# Patient Record
Sex: Male | Born: 1965 | Race: White | Hispanic: No | Marital: Single
Health system: Southern US, Community
[De-identification: ages and names within clinical notes are randomized; demographics above are authoritative.]

---

## 1999-12-01 ENCOUNTER — Emergency Department (HOSPITAL_COMMUNITY): Admission: EM | Admit: 1999-12-01 | Discharge: 1999-12-01 | Payer: Self-pay | Admitting: Unknown Physician Specialty

## 2000-10-16 ENCOUNTER — Encounter: Admission: RE | Admit: 2000-10-16 | Discharge: 2000-10-16 | Payer: Self-pay | Admitting: Internal Medicine

## 2000-10-16 ENCOUNTER — Encounter: Payer: Self-pay | Admitting: Internal Medicine

## 2007-12-25 ENCOUNTER — Ambulatory Visit: Payer: Self-pay | Admitting: Internal Medicine

## 2011-07-15 ENCOUNTER — Encounter: Payer: Self-pay | Admitting: Internal Medicine

## 2011-11-14 ENCOUNTER — Emergency Department (HOSPITAL_COMMUNITY)
Admission: EM | Admit: 2011-11-14 | Discharge: 2011-11-15 | Disposition: A | Discharge status: Home / Self Care | Payer: No Typology Code available for payment source | Attending: Emergency Medicine | Admitting: Emergency Medicine

## 2011-11-14 ENCOUNTER — Encounter (HOSPITAL_COMMUNITY): Payer: Self-pay | Admitting: *Deleted

## 2011-11-14 DIAGNOSIS — F17200 Nicotine dependence, unspecified, uncomplicated: Secondary | ICD-10-CM | POA: Insufficient documentation

## 2011-11-14 DIAGNOSIS — Y92410 Unspecified street and highway as the place of occurrence of the external cause: Secondary | ICD-10-CM | POA: Insufficient documentation

## 2011-11-14 DIAGNOSIS — Y998 Other external cause status: Secondary | ICD-10-CM | POA: Insufficient documentation

## 2011-11-14 DIAGNOSIS — Y93I9 Activity, other involving external motion: Secondary | ICD-10-CM | POA: Insufficient documentation

## 2011-11-14 DIAGNOSIS — S161XXA Strain of muscle, fascia and tendon at neck level, initial encounter: Secondary | ICD-10-CM

## 2011-11-14 DIAGNOSIS — S139XXA Sprain of joints and ligaments of unspecified parts of neck, initial encounter: Secondary | ICD-10-CM | POA: Insufficient documentation

## 2011-11-14 DIAGNOSIS — S5010XA Contusion of unspecified forearm, initial encounter: Secondary | ICD-10-CM

## 2011-11-14 MED ORDER — ONDANSETRON 8 MG PO TBDP
ORAL_TABLET | ORAL | Status: AC
  Administered 2011-11-14: 22:00:00
  Filled 2011-11-14: qty 1
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
## 2011-11-15 ENCOUNTER — Emergency Department (HOSPITAL_COMMUNITY): Payer: No Typology Code available for payment source

## 2011-11-15 MED ORDER — HYDROCODONE-ACETAMINOPHEN 5-325 MG PO TABS: 1.0000 | ORAL_TABLET | ORAL | Status: AC | PRN

## 2011-11-15 MED ORDER — IBUPROFEN 800 MG PO TABS: 800.0000 mg | ORAL_TABLET | Freq: Three times a day (TID) | ORAL | Status: AC

## 2011-11-15 MED ORDER — CYCLOBENZAPRINE HCL 10 MG PO TABS: 10.0000 mg | ORAL_TABLET | Freq: Two times a day (BID) | ORAL | Status: AC | PRN
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
## 2011-11-15 NOTE — ED Provider Notes (Signed)
Medical screening examination/treatment/procedure(s) were performed by non-physician practitioner and as supervising physician I was immediately available for consultation/collaboration.    Vida Roller, MD 11/15/11 [phone]

## 2012-09-11 ENCOUNTER — Other Ambulatory Visit: Payer: Self-pay | Admitting: Internal Medicine

## 2012-09-14 ENCOUNTER — Encounter: Payer: Self-pay | Admitting: Internal Medicine

## 2012-09-14 ENCOUNTER — Other Ambulatory Visit (INDEPENDENT_AMBULATORY_CARE_PROVIDER_SITE_OTHER): Payer: BC Managed Care – PPO | Admitting: Internal Medicine

## 2012-09-14 ENCOUNTER — Ambulatory Visit (INDEPENDENT_AMBULATORY_CARE_PROVIDER_SITE_OTHER): Payer: BC Managed Care – PPO | Admitting: Internal Medicine

## 2012-09-14 VITALS — BP 128/86 | HR 84 | Temp 98.9°F | Ht 71.0 in | Wt 228.0 lb

## 2012-09-14 DIAGNOSIS — Z0000 Encounter for general adult medical examination without abnormal findings: Secondary | ICD-10-CM

## 2012-09-14 DIAGNOSIS — R5383 Other fatigue: Secondary | ICD-10-CM

## 2012-09-14 DIAGNOSIS — E669 Obesity, unspecified: Secondary | ICD-10-CM

## 2012-09-14 DIAGNOSIS — Z13220 Encounter for screening for lipoid disorders: Secondary | ICD-10-CM

## 2012-09-14 DIAGNOSIS — Z23 Encounter for immunization: Secondary | ICD-10-CM

## 2012-09-14 DIAGNOSIS — Z130 Encounter for screening for diseases of the blood and blood-forming organs and certain disorders involving the immune mechanism: Secondary | ICD-10-CM

## 2012-09-14 DIAGNOSIS — M542 Cervicalgia: Secondary | ICD-10-CM

## 2012-09-14 DIAGNOSIS — R5381 Other malaise: Secondary | ICD-10-CM

## 2012-09-14 DIAGNOSIS — J309 Allergic rhinitis, unspecified: Secondary | ICD-10-CM | POA: Insufficient documentation

## 2012-09-14 LAB — CBC WITH DIFFERENTIAL/PLATELET
Basophils Absolute: 0 10*3/uL (ref 0.0–0.1)
Basophils Relative: 0 % (ref 0–1)
Eosinophils Absolute: 0.3 10*3/uL (ref 0.0–0.7)
Eosinophils Relative: 3 % (ref 0–5)
HCT: 44.1 % (ref 39.0–52.0)
Hemoglobin: 15.5 g/dL (ref 13.0–17.0)
Lymphocytes Relative: 20 % (ref 12–46)
Lymphs Abs: 1.6 10*3/uL (ref 0.7–4.0)
MCH: 32.4 pg (ref 26.0–34.0)
MCHC: 35.1 g/dL (ref 30.0–36.0)
MCV: 92.3 fL (ref 78.0–100.0)
Monocytes Absolute: 0.8 10*3/uL (ref 0.1–1.0)
Monocytes Relative: 10 % (ref 3–12)
Neutro Abs: 5.3 10*3/uL (ref 1.7–7.7)
Neutrophils Relative %: 67 % (ref 43–77)
Platelets: 196 10*3/uL (ref 150–400)
RBC: 4.78 MIL/uL (ref 4.22–5.81)
RDW: 13.7 % (ref 11.5–15.5)
WBC: 7.9 10*3/uL (ref 4.0–10.5)

## 2012-09-14 LAB — COMPREHENSIVE METABOLIC PANEL
ALT: 26 U/L (ref 0–53)
AST: 19 U/L (ref 0–37)
Albumin: 4.4 g/dL (ref 3.5–5.2)
Alkaline Phosphatase: 71 U/L (ref 39–117)
BUN: 12 mg/dL (ref 6–23)
CO2: 25 mEq/L (ref 19–32)
Calcium: 9.2 mg/dL (ref 8.4–10.5)
Chloride: 104 mEq/L (ref 96–112)
Creat: 0.98 mg/dL (ref 0.50–1.35)
Glucose, Bld: 87 mg/dL (ref 70–99)
Potassium: 4.4 mEq/L (ref 3.5–5.3)
Sodium: 139 mEq/L (ref 135–145)
Total Bilirubin: 0.9 mg/dL (ref 0.3–1.2)
Total Protein: 6.3 g/dL (ref 6.0–8.3)

## 2012-09-14 LAB — LIPID PANEL
Cholesterol: 142 mg/dL (ref 0–200)
HDL: 33 mg/dL — ABNORMAL LOW (ref >39)
LDL Cholesterol: 90 mg/dL (ref 0–99)
Total CHOL/HDL Ratio: 4.3 Ratio
Triglycerides: 96 mg/dL (ref <150)
VLDL: 19 mg/dL (ref 0–40)

## 2012-09-14 LAB — POCT URINALYSIS DIPSTICK
Ketones, UA: NEGATIVE
Leukocytes, UA: NEGATIVE
Nitrite, UA: NEGATIVE
Protein, UA: NEGATIVE

## 2012-09-14 LAB — TSH: TSH: 2.622 u[IU]/mL (ref 0.350–4.500)

## 2012-09-14 MED ORDER — TETANUS-DIPHTH-ACELL PERTUSSIS 5-2.5-18.5 LF-MCG/0.5 IM SUSP: 0.5000 mL | Freq: Once | INTRAMUSCULAR | Status: AC

## 2012-09-14 NOTE — Patient Instructions (Addendum)
You have been given tetanus update today. Use Flonase nasal spray and try Claritin. Return in one year. Recommend diet and exercise.

## 2012-09-14 NOTE — Addendum Note (Signed)
Addended by: Judy Pimple on: 09/14/2012 10:50 AM   Modules accepted: Orders
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
## 2016-06-07 ENCOUNTER — Other Ambulatory Visit: Payer: BLUE CROSS/BLUE SHIELD | Admitting: Internal Medicine

## 2016-06-07 DIAGNOSIS — Z125 Encounter for screening for malignant neoplasm of prostate: Secondary | ICD-10-CM

## 2016-06-07 DIAGNOSIS — Z13220 Encounter for screening for lipoid disorders: Secondary | ICD-10-CM

## 2016-06-07 DIAGNOSIS — Z0000 Encounter for general adult medical examination without abnormal findings: Secondary | ICD-10-CM

## 2016-06-07 LAB — COMPREHENSIVE METABOLIC PANEL
ALBUMIN: 4.4 g/dL (ref 3.6–5.1)
ALK PHOS: 65 U/L (ref 40–115)
ALT: 21 U/L (ref 9–46)
AST: 18 U/L (ref 10–35)
BUN: 12 mg/dL (ref 7–25)
CALCIUM: 9 mg/dL (ref 8.6–10.3)
CO2: 25 mmol/L (ref 20–31)
Chloride: 104 mmol/L (ref 98–110)
Creat: 1.05 mg/dL (ref 0.70–1.33)
GLUCOSE: 89 mg/dL (ref 65–99)
Potassium: 4.4 mmol/L (ref 3.5–5.3)
Sodium: 140 mmol/L (ref 135–146)
Total Bilirubin: 0.8 mg/dL (ref 0.2–1.2)
Total Protein: 6.5 g/dL (ref 6.1–8.1)

## 2016-06-07 LAB — CBC WITH DIFFERENTIAL/PLATELET
BASOS PCT: 0 %
Basophils Absolute: 0 cells/uL (ref 0–200)
EOS ABS: 280 {cells}/uL (ref 15–500)
Eosinophils Relative: 4 %
HEMATOCRIT: 45.6 % (ref 38.5–50.0)
Hemoglobin: 15.5 g/dL (ref 13.2–17.1)
LYMPHS PCT: 21 %
Lymphs Abs: 1470 cells/uL (ref 850–3900)
MCH: 32.9 pg (ref 27.0–33.0)
MCHC: 34 g/dL (ref 32.0–36.0)
MCV: 96.8 fL (ref 80.0–100.0)
MONO ABS: 490 {cells}/uL (ref 200–950)
MPV: 9.7 fL (ref 7.5–12.5)
Monocytes Relative: 7 %
NEUTROS PCT: 68 %
Neutro Abs: 4760 cells/uL (ref 1500–7800)
Platelets: 197 10*3/uL (ref 140–400)
RBC: 4.71 MIL/uL (ref 4.20–5.80)
RDW: 14 % (ref 11.0–15.0)
WBC: 7 10*3/uL (ref 3.8–10.8)

## 2016-06-07 LAB — LIPID PANEL
Cholesterol: 154 mg/dL (ref <200)
HDL: 41 mg/dL (ref >40)
LDL Cholesterol: 97 mg/dL (ref <100)
TRIGLYCERIDES: 79 mg/dL (ref <150)
Total CHOL/HDL Ratio: 3.8 Ratio (ref <5.0)
VLDL: 16 mg/dL (ref <30)

## 2016-06-08 LAB — PSA: PSA: 0.5 ng/mL (ref <=4.0)

## 2016-06-11 ENCOUNTER — Ambulatory Visit (INDEPENDENT_AMBULATORY_CARE_PROVIDER_SITE_OTHER): Payer: BLUE CROSS/BLUE SHIELD | Admitting: Internal Medicine

## 2016-06-11 ENCOUNTER — Encounter: Payer: Self-pay | Admitting: Internal Medicine

## 2016-06-11 VITALS — BP 132/90 | HR 87 | Temp 98.0°F | Ht 70.5 in | Wt 224.0 lb

## 2016-06-11 DIAGNOSIS — Z6831 Body mass index (BMI) 31.0-31.9, adult: Secondary | ICD-10-CM | POA: Diagnosis not present

## 2016-06-11 DIAGNOSIS — M5442 Lumbago with sciatica, left side: Secondary | ICD-10-CM

## 2016-06-11 DIAGNOSIS — Z8739 Personal history of other diseases of the musculoskeletal system and connective tissue: Secondary | ICD-10-CM | POA: Diagnosis not present

## 2016-06-11 DIAGNOSIS — Z0000 Encounter for general adult medical examination without abnormal findings: Secondary | ICD-10-CM

## 2016-06-11 LAB — POCT URINALYSIS DIPSTICK
Bilirubin, UA: NEGATIVE
Glucose, UA: NEGATIVE
KETONES UA: NEGATIVE
Leukocytes, UA: NEGATIVE
Nitrite, UA: NEGATIVE
Protein, UA: NEGATIVE
RBC UA: NEGATIVE
SPEC GRAV UA: 1.015 (ref 1.030–1.035)
Urobilinogen, UA: 0.2 (ref ?–2.0)
pH, UA: 6 (ref 5.0–8.0)

## 2016-06-11 MED ORDER — HYDROCODONE-ACETAMINOPHEN 5-325 MG PO TABS: 1.0000 | ORAL_TABLET | Freq: Four times a day (QID) | ORAL | 0 refills | Status: DC | PRN

## 2016-06-11 MED ORDER — CYCLOBENZAPRINE HCL 10 MG PO TABS: 10.0000 mg | ORAL_TABLET | Freq: Three times a day (TID) | ORAL | 0 refills | Status: DC | PRN

## 2016-06-11 MED ORDER — PREDNISONE 10 MG PO TABS: ORAL_TABLET | ORAL | 0 refills | Status: DC

## 2016-06-11 NOTE — Patient Instructions (Signed)
Take prednisone in tapering course as directed. Flexeril 10 mg at bedtime. Norco 5/325 sparingly for severe back pain. Will work on diet and exercise. Return in one year or as needed
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
## 2016-06-14 ENCOUNTER — Encounter: Payer: Self-pay | Admitting: Internal Medicine

## 2017-07-17 ENCOUNTER — Encounter: Payer: Self-pay | Admitting: Internal Medicine

## 2017-07-17 ENCOUNTER — Telehealth: Payer: Self-pay | Admitting: Internal Medicine

## 2017-07-17 DIAGNOSIS — Z1211 Encounter for screening for malignant neoplasm of colon: Secondary | ICD-10-CM

## 2017-07-17 NOTE — Telephone Encounter (Signed)
Please make referral for screening colonoscopy

## 2017-07-17 NOTE — Telephone Encounter (Signed)
Referral was put in  

## 2017-07-17 NOTE — Telephone Encounter (Signed)
Lanora Manis Szafran Spouse [phone]  Lanora Manis called to schedule CPE for Erik Wilcox and to also say that he is now ready to get a referal for a colonoscopy. He is having no issues at this time.

## 2017-08-20 ENCOUNTER — Other Ambulatory Visit: Payer: Self-pay

## 2017-08-20 DIAGNOSIS — E785 Hyperlipidemia, unspecified: Secondary | ICD-10-CM

## 2017-08-20 DIAGNOSIS — Z0000 Encounter for general adult medical examination without abnormal findings: Secondary | ICD-10-CM

## 2017-08-20 DIAGNOSIS — Z125 Encounter for screening for malignant neoplasm of prostate: Secondary | ICD-10-CM

## 2017-09-01 ENCOUNTER — Other Ambulatory Visit: Payer: BLUE CROSS/BLUE SHIELD | Admitting: Internal Medicine

## 2017-09-01 DIAGNOSIS — Z0000 Encounter for general adult medical examination without abnormal findings: Secondary | ICD-10-CM

## 2017-09-01 DIAGNOSIS — Z125 Encounter for screening for malignant neoplasm of prostate: Secondary | ICD-10-CM

## 2017-09-01 DIAGNOSIS — E785 Hyperlipidemia, unspecified: Secondary | ICD-10-CM

## 2017-09-04 ENCOUNTER — Other Ambulatory Visit: Payer: BLUE CROSS/BLUE SHIELD | Admitting: Internal Medicine

## 2017-09-08 ENCOUNTER — Encounter: Payer: Self-pay | Admitting: Internal Medicine

## 2017-09-08 ENCOUNTER — Ambulatory Visit (INDEPENDENT_AMBULATORY_CARE_PROVIDER_SITE_OTHER): Payer: BLUE CROSS/BLUE SHIELD | Admitting: Internal Medicine

## 2017-09-08 VITALS — BP 130/80 | HR 68 | Ht 70.5 in | Wt 224.0 lb

## 2017-09-08 DIAGNOSIS — Z0000 Encounter for general adult medical examination without abnormal findings: Secondary | ICD-10-CM | POA: Diagnosis not present

## 2017-09-08 DIAGNOSIS — E786 Lipoprotein deficiency: Secondary | ICD-10-CM | POA: Diagnosis not present

## 2017-09-08 DIAGNOSIS — Z6831 Body mass index (BMI) 31.0-31.9, adult: Secondary | ICD-10-CM | POA: Diagnosis not present

## 2017-09-08 LAB — POCT URINALYSIS DIPSTICK
Appearance: NORMAL
Bilirubin, UA: NEGATIVE
Blood, UA: NEGATIVE
Glucose, UA: NEGATIVE
KETONES UA: NEGATIVE
Leukocytes, UA: NEGATIVE
NITRITE UA: NEGATIVE
Odor: NORMAL
Protein, UA: NEGATIVE
SPEC GRAV UA: 1.015 (ref 1.010–1.025)
UROBILINOGEN UA: 0.2 U/dL
pH, UA: 6 (ref 5.0–8.0)

## 2017-09-08 NOTE — Patient Instructions (Addendum)
Discussion regarding diet and exercise. Cologard info given. RTC one year or as needed.

## 2017-09-08 NOTE — Progress Notes (Signed)
   Subjective:    Patient ID: Erik Wilcox, male    DOB: [birth year], 52 y.o.   MRN: [id number]  HPI 52 year old Male for health maintenance exam  and evaluation of medical issues. Has a few ortho problems treated with Meloxicam. Labs reviewed and are WNL. Has low HDL cholesterol.  Wants to do Cologard testing for colon cancer.Info provided.  BMI is 31.69. Discussion about diet and exercise.Does do martial arts but not much recently. Thinking about getting back into it. Had some knee issues that interfered with kicks.Left ankle surgery 1995, Right shoulder surgery 1996. Left knee meniscal tear 2015.  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 Social alcohol consumption consisting of maybe 1 beer a couple of times a week.  He remarried around 2010.  No children from present marriage.  Has a son and a daughter from his marriage to Erik ScheinBeth Wilcox.  Occasionally smokes a cigar.  He has an older daughter from a previous relationship who has diabetes mellitus.  Family history: One sister in good health.  Parents in good health.    Review of Systems  Respiratory: Negative.   Cardiovascular: Negative.   Gastrointestinal: Negative.   Genitourinary: Negative.   Neurological: Negative.   Psychiatric/Behavioral: Negative.   All other systems reviewed and are negative.  left knee, right shoulder, and neck pain treated by Dr. Yisroel Rammingaldorf with Meloxicam     Objective:   Physical Exam  Constitutional: He is oriented to person, place, and time. He appears well-developed and well-nourished. No distress.  HENT:  Head: Normocephalic and atraumatic.  Right Ear: External ear normal.  Left Ear: External ear normal.  Nose: Nose normal.  Mouth/Throat: Oropharynx is clear and moist.  Eyes: Pupils are equal, round, and reactive to light. EOM are normal. Right eye exhibits no discharge. Left eye exhibits no discharge. No scleral icterus.    Neck: No JVD present. No thyromegaly present.  Cardiovascular: Normal rate, regular rhythm and normal heart sounds.  No murmur heard. Pulmonary/Chest: No respiratory distress. He has no wheezes. He has no rales.  Abdominal: Soft. He exhibits no distension and no mass. There is no tenderness. There is no rebound and no guarding.  Genitourinary: Prostate normal.  Musculoskeletal: He exhibits no edema.  Lymphadenopathy:    He has no cervical adenopathy.  Neurological: He is alert and oriented to person, place, and time. He displays normal reflexes. No cranial nerve deficit. Coordination normal.  Skin: Skin is warm and dry. No rash noted. He is not diaphoretic.  Psychiatric: He has a normal mood and affect. His behavior is normal. Judgment and thought content normal.  Vitals reviewed.         Assessment & Plan:  BMI 31-work on diet exercise and weight loss  Need for colon cancer screening-he prefers Cologuard.  Information provided  Low HDL cholesterol  Plan: Return in 1 year or as needed.  Tetanus immunization is up-to-date.

## 2017-10-06 ENCOUNTER — Encounter: Payer: Self-pay | Admitting: Internal Medicine

## 2017-12-16 LAB — COMPLETE METABOLIC PANEL WITH GFR
AG RATIO: 2.4 (calc) (ref 1.0–2.5)
ALT: 19 U/L (ref 9–46)
AST: 18 U/L (ref 10–35)
Albumin: 4.5 g/dL (ref 3.6–5.1)
Alkaline phosphatase (APISO): 66 U/L (ref 40–115)
BILIRUBIN TOTAL: 0.8 mg/dL (ref 0.2–1.2)
BUN: 14 mg/dL (ref 7–25)
CALCIUM: 9.2 mg/dL (ref 8.6–10.3)
CHLORIDE: 106 mmol/L (ref 98–110)
CO2: 27 mmol/L (ref 20–32)
Creat: 0.97 mg/dL (ref 0.70–1.33)
GFR, EST AFRICAN AMERICAN: 103 mL/min/{1.73_m2} (ref >=60)
GFR, EST NON AFRICAN AMERICAN: 89 mL/min/{1.73_m2} (ref >=60)
GLOBULIN: 1.9 g/dL (ref 1.9–3.7)
Glucose, Bld: 96 mg/dL (ref 65–99)
POTASSIUM: 4.5 mmol/L (ref 3.5–5.3)
SODIUM: 140 mmol/L (ref 135–146)
Total Protein: 6.4 g/dL (ref 6.1–8.1)

## 2017-12-16 LAB — CBC WITH DIFFERENTIAL/PLATELET
BASOS ABS: 7 {cells}/uL (ref 0–200)
Basophils Relative: 0.1 %
EOS ABS: 280 {cells}/uL (ref 15–500)
EOS PCT: 4 %
HCT: 42 % (ref 38.5–50.0)
Hemoglobin: 14.6 g/dL (ref 13.2–17.1)
Lymphs Abs: 1519 cells/uL (ref 850–3900)
MCH: 33.3 pg — AB (ref 27.0–33.0)
MCHC: 34.8 g/dL (ref 32.0–36.0)
MCV: 95.7 fL (ref 80.0–100.0)
MONOS PCT: 6.9 %
MPV: 10.3 fL (ref 7.5–12.5)
Neutro Abs: 4711 cells/uL (ref 1500–7800)
Neutrophils Relative %: 67.3 %
PLATELETS: 185 10*3/uL (ref 140–400)
RBC: 4.39 10*6/uL (ref 4.20–5.80)
RDW: 12.6 % (ref 11.0–15.0)
TOTAL LYMPHOCYTE: 21.7 %
WBC mixed population: 483 cells/uL (ref 200–950)
WBC: 7 10*3/uL (ref 3.8–10.8)

## 2017-12-16 LAB — LIPID PANEL
CHOL/HDL RATIO: 3.5 (calc) (ref <5.0)
CHOLESTEROL: 136 mg/dL (ref <200)
HDL: 39 mg/dL — ABNORMAL LOW (ref >40)
LDL Cholesterol (Calc): 83 mg/dL (calc)
NON-HDL CHOLESTEROL (CALC): 97 mg/dL (ref <130)
TRIGLYCERIDES: 67 mg/dL (ref <150)

## 2017-12-16 LAB — PSA: PSA: 0.5 ng/mL (ref <=4.0)

## 2023-08-13 ENCOUNTER — Emergency Department (HOSPITAL_COMMUNITY): Payer: Self-pay

## 2023-08-13 ENCOUNTER — Observation Stay (HOSPITAL_COMMUNITY)
Admission: EM | Admit: 2023-08-13 | Discharge: 2023-08-14 | Disposition: A | Discharge status: Home / Self Care | Payer: Self-pay | Attending: Internal Medicine | Admitting: Internal Medicine

## 2023-08-13 ENCOUNTER — Other Ambulatory Visit: Payer: Self-pay

## 2023-08-13 ENCOUNTER — Encounter (HOSPITAL_COMMUNITY): Payer: Self-pay

## 2023-08-13 DIAGNOSIS — Z79899 Other long term (current) drug therapy: Secondary | ICD-10-CM | POA: Insufficient documentation

## 2023-08-13 DIAGNOSIS — Z7982 Long term (current) use of aspirin: Secondary | ICD-10-CM | POA: Insufficient documentation

## 2023-08-13 DIAGNOSIS — D72829 Elevated white blood cell count, unspecified: Secondary | ICD-10-CM | POA: Insufficient documentation

## 2023-08-13 DIAGNOSIS — Z7722 Contact with and (suspected) exposure to environmental tobacco smoke (acute) (chronic): Secondary | ICD-10-CM | POA: Insufficient documentation

## 2023-08-13 DIAGNOSIS — R000 Tachycardia, unspecified: Principal | ICD-10-CM | POA: Insufficient documentation

## 2023-08-13 DIAGNOSIS — S0101XA Laceration without foreign body of scalp, initial encounter: Secondary | ICD-10-CM | POA: Insufficient documentation

## 2023-08-13 DIAGNOSIS — Y9289 Other specified places as the place of occurrence of the external cause: Secondary | ICD-10-CM | POA: Insufficient documentation

## 2023-08-13 DIAGNOSIS — E860 Dehydration: Secondary | ICD-10-CM | POA: Insufficient documentation

## 2023-08-13 DIAGNOSIS — R55 Syncope and collapse: Principal | ICD-10-CM | POA: Insufficient documentation

## 2023-08-13 DIAGNOSIS — W19XXXA Unspecified fall, initial encounter: Secondary | ICD-10-CM | POA: Insufficient documentation

## 2023-08-13 DIAGNOSIS — R7989 Other specified abnormal findings of blood chemistry: Secondary | ICD-10-CM | POA: Insufficient documentation

## 2023-08-13 LAB — CBC WITH DIFFERENTIAL/PLATELET
Abs Immature Granulocytes: 0.04 10*3/uL (ref 0.00–0.07)
Basophils Absolute: 0 10*3/uL (ref 0.0–0.1)
Basophils Relative: 0 %
Eosinophils Absolute: 0.1 10*3/uL (ref 0.0–0.5)
Eosinophils Relative: 1 %
HCT: 45 % (ref 39.0–52.0)
Hemoglobin: 15.6 g/dL (ref 13.0–17.0)
Immature Granulocytes: 0 %
Lymphocytes Relative: 9 %
Lymphs Abs: 1.1 10*3/uL (ref 0.7–4.0)
MCH: 33.2 pg (ref 26.0–34.0)
MCHC: 34.7 g/dL (ref 30.0–36.0)
MCV: 95.7 fL (ref 80.0–100.0)
Monocytes Absolute: 0.9 10*3/uL (ref 0.1–1.0)
Monocytes Relative: 7 %
Neutro Abs: 10.3 10*3/uL — ABNORMAL HIGH (ref 1.7–7.7)
Neutrophils Relative %: 83 %
Platelets: 209 10*3/uL (ref 150–400)
RBC: 4.7 MIL/uL (ref 4.22–5.81)
RDW: 12.7 % (ref 11.5–15.5)
WBC: 12.5 10*3/uL — ABNORMAL HIGH (ref 4.0–10.5)
nRBC: 0 % (ref 0.0–0.2)

## 2023-08-13 LAB — MAGNESIUM: Magnesium: 1.9 mg/dL (ref 1.7–2.4)

## 2023-08-13 LAB — TROPONIN I (HIGH SENSITIVITY): Troponin I (High Sensitivity): 31 ng/L — ABNORMAL HIGH (ref <18)

## 2023-08-13 LAB — BASIC METABOLIC PANEL WITH GFR
Anion gap: 13 (ref 5–15)
BUN: 19 mg/dL (ref 6–20)
CO2: 22 mmol/L (ref 22–32)
Calcium: 9.5 mg/dL (ref 8.9–10.3)
Chloride: 102 mmol/L (ref 98–111)
Creatinine, Ser: 1.1 mg/dL (ref 0.61–1.24)
GFR, Estimated: >60 mL/min (ref >60)
Glucose, Bld: 145 mg/dL — ABNORMAL HIGH (ref 70–99)
Potassium: 4.1 mmol/L (ref 3.5–5.1)
Sodium: 137 mmol/L (ref 135–145)

## 2023-08-13 MED ORDER — LACTATED RINGERS IV BOLUS
2000.0000 mL | Freq: Once | INTRAVENOUS | Status: AC
  Administered 2023-08-13: 2000 mL via INTRAVENOUS

## 2023-08-13 MED ORDER — LIDOCAINE-EPINEPHRINE-TETRACAINE (LET) TOPICAL GEL
3.0000 mL | Freq: Once | TOPICAL | Status: AC
  Administered 2023-08-13: 3 mL via TOPICAL
  Filled 2023-08-13: qty 3
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
## 2023-08-13 NOTE — ED Provider Notes (Signed)
 [place] [facility] AT [facility] Provider Note   CSN: [id number] Arrival date & time: 08/13/23  2045     History  Chief Complaint  Patient presents with   Loss of Consciousness   Head Injury   Fall    Dannell Gortney Carra is a 58 y.o. male.   Loss of Consciousness Head Injury Fall  Patient presents for syncope and fall.  He has no known chronic medical conditions.  He does not take any daily medications.  He has not ate or drink for the past 2 days.  He states that this is because he has been so busy with his work.  He did go to [employer] for dinner tonight.  He was able to eat a little bit but then had a syncopal episode.  While standing, he had a prodrome of dizziness and nausea.  He had a syncopal episode and fell backwards.  He did strike his head on the ground.  He sustained a laceration to his scalp.  He arrives via EMS.  EMS noted normal mentation and normal vital signs.  On arrival, patient endorses some mild pain in the area of his scalp hematoma and laceration.  He denies any other areas of discomfort.     Home Medications Prior to Admission medications   Medication Sig Start Date End Date Taking? Authorizing Provider  aspirin 81 MG tablet Take 81 mg by mouth daily.    [provider]  cyclobenzaprine  (FLEXERIL ) 10 MG tablet Take 1 tablet (10 mg total) by mouth 3 (three) times daily as needed for muscle spasms. 06/11/16   Sylvan Evener, MD  HYDROcodone -acetaminophen  (NORCO) 5-325 MG tablet Take 1 tablet by mouth every 6 (six) hours as needed for moderate pain. Patient not taking: Reported on 09/08/2017 06/11/16   Sylvan Evener, MD  meloxicam (MOBIC) 15 MG tablet Take 15 mg by mouth daily.    [provider]  predniSONE  (DELTASONE ) 10 MG tablet Take in tapering course as directed 6-5-4-3-2-1 06/11/16   Sylvan Evener, MD      Allergies    Penicillins    Review of Systems   Review of Systems  Cardiovascular:  Positive for  syncope.  Skin:  Positive for wound.  Neurological:  Positive for syncope.  All other systems reviewed and are negative.   Physical Exam Updated Vital Signs BP (!) 149/88   Pulse (!) 102   Temp 98.3 F (36.8 C) (Oral)   Resp 16   Ht 5\' 11"  (1.803 m)   Wt 99.8 kg   SpO2 98%   BMI 30.68 kg/m  Physical Exam Vitals and nursing note reviewed.  Constitutional:      General: He is not in acute distress.    Appearance: Normal appearance. He is well-developed. He is not ill-appearing, toxic-appearing or diaphoretic.  HENT:     Head: Normocephalic.     Comments: Hematoma and laceration to left parietal scalp    Right Ear: External ear normal.     Left Ear: External ear normal.     Nose: Nose normal.     Mouth/Throat:     Mouth: Mucous membranes are moist.  Eyes:     Extraocular Movements: Extraocular movements intact.     Conjunctiva/sclera: Conjunctivae normal.  Cardiovascular:     Rate and Rhythm: Normal rate and regular rhythm.  Pulmonary:     Effort: Pulmonary effort is normal. No respiratory distress.  Abdominal:     General: There  is no distension.     Palpations: Abdomen is soft.     Tenderness: There is no abdominal tenderness.  Musculoskeletal:        General: No swelling or deformity. Normal range of motion.     Cervical back: Normal range of motion and neck supple.  Skin:    General: Skin is warm and dry.     Coloration: Skin is not jaundiced or pale.  Neurological:     General: No focal deficit present.     Mental Status: He is alert and oriented to person, place, and time.     Cranial Nerves: No cranial nerve deficit.     Sensory: No sensory deficit.     Motor: No weakness.     Coordination: Coordination normal.  Psychiatric:        Mood and Affect: Mood normal.        Behavior: Behavior normal.     ED Results / Procedures / Treatments   Labs (all labs ordered are listed, but only abnormal results are displayed) Labs Reviewed  BASIC METABOLIC PANEL  WITH GFR - Abnormal; Notable for the following components:      Result Value   Glucose, Bld 145 (*)    All other components within normal limits  CBC WITH DIFFERENTIAL/PLATELET - Abnormal; Notable for the following components:   WBC 12.5 (*)    Neutro Abs 10.3 (*)    All other components within normal limits  TROPONIN I (HIGH SENSITIVITY) - Abnormal; Notable for the following components:   Troponin I (High Sensitivity) 31 (*)    All other components within normal limits  MAGNESIUM  CBG MONITORING, ED  TROPONIN I (HIGH SENSITIVITY)    EKG EKG Interpretation Date/Time:  Wednesday Aug 13 2023 21:25:25 EDT Ventricular Rate:  102 PR Interval:  153 QRS Duration:  145 QT Interval:  370 QTC Calculation: 482 R Axis:   54  Text Interpretation: Sinus tachycardia Right bundle branch block Confirmed by Iva Mariner ([id number]) on 08/13/2023 10:59:00 PM  Radiology CT CERVICAL SPINE WO CONTRAST Result Date: 08/13/2023 CLINICAL DATA:  Neck trauma, dangerous injury mechanism (Age 11-64y) EXAM: CT CERVICAL SPINE WITHOUT CONTRAST TECHNIQUE: Multidetector CT imaging of the cervical spine was performed without intravenous contrast. Multiplanar CT image reconstructions were also generated. RADIATION DOSE REDUCTION: This exam was performed according to the departmental dose-optimization program which includes automated exposure control, adjustment of the mA and/or kV according to patient size and/or use of iterative reconstruction technique. COMPARISON:  None Available. FINDINGS: Alignment: Normal. Skull base and vertebrae: No acute fracture. Vertebral body heights are maintained. Schmorl's nodes and inferior C3, superior and inferior C4, and inferior C5 endplates. The dens and skull base are intact. Soft tissues and spinal canal: No prevertebral fluid or swelling. No visible canal hematoma. Disc levels: Disc space narrowing and spurring C2-C3, C3-C4, C4-C5 and C5-C6. No high-grade canal stenosis. Upper chest: No  acute findings. Other: None. IMPRESSION: 1. No acute fracture or subluxation of the cervical spine. 2. Multilevel degenerative disc disease. Electronically Signed   By: Chadwick Colonel M.D.   On: 08/13/2023 22:48   CT HEAD WO CONTRAST Result Date: 08/13/2023 CLINICAL DATA:  Syncope and collapse. EXAM: CT HEAD WITHOUT CONTRAST TECHNIQUE: Contiguous axial images were obtained from the base of the skull through the vertex without intravenous contrast. RADIATION DOSE REDUCTION: This exam was performed according to the departmental dose-optimization program which includes automated exposure control, adjustment of the mA and/or kV according to patient size  and/or use of iterative reconstruction technique. COMPARISON:  None Available. FINDINGS: Brain: No evidence of acute infarction, hemorrhage, hydrocephalus, extra-axial collection or mass lesion/mass effect. Vascular: No hyperdense vessel or unexpected calcification. Skull: Normal. Negative for fracture or focal lesion. Sinuses/Orbits: There is moderate to marked severity bilateral ethmoid sinus mucosal thickening. Mild right maxillary sinus mucosal thickening is also seen. Other: Mild left posterior parietal scalp soft tissue swelling is seen near the vertex. An associated small scalp soft tissue hematoma is noted. IMPRESSION: 1. No acute intracranial abnormality. 2. Mild left posterior parietal scalp soft tissue swelling and small scalp soft tissue hematoma. 3. Moderate to marked severity bilateral ethmoid sinus and mild right maxillary sinus disease. Electronically Signed   By: Virgle Grime M.D.   On: 08/13/2023 22:16   DG Chest Port 1 View Result Date: 08/13/2023 CLINICAL DATA:  Status post fall EXAM: PORTABLE CHEST 1 VIEW COMPARISON:  None Available. FINDINGS: Normal cardiomediastinal silhouette. No focal consolidation, pleural effusion, or pneumothorax. No displaced rib fractures. IMPRESSION: No acute cardiopulmonary disease. Electronically Signed   By:  Rozell Cornet M.D.   On: 08/13/2023 21:13    Procedures .Laceration Repair  Date/Time: 08/13/2023 11:28 PM  Performed by: Iva Mariner, MD Authorized by: Iva Mariner, MD   Consent:    Consent obtained:  Verbal   Consent given by:  Patient   Risks, benefits, and alternatives were discussed: yes     Risks discussed:  Pain, infection and poor cosmetic result   Alternatives discussed:  No treatment and delayed treatment Universal protocol:    Procedure explained and questions answered to patient or proxy's satisfaction: yes     Imaging studies available: yes     Patient identity confirmed:  Verbally with patient Anesthesia:    Anesthesia method:  Topical application   Topical anesthetic:  LET Laceration details:    Location:  Scalp   Scalp location:  L parietal   Length (cm):  4   Depth (mm):  5 Pre-procedure details:    Preparation:  Imaging obtained to evaluate for foreign bodies Treatment:    Area cleansed with:  Saline   Amount of cleaning:  Standard   Irrigation solution:  Sterile saline   Irrigation volume:  50   Irrigation method:  Syringe   Visualized foreign bodies/material removed: no   Skin repair:    Repair method:  Staples   Number of staples:  7 Approximation:    Approximation:  Close Repair type:    Repair type:  Simple Post-procedure details:    Dressing: Xeroform and Tegaderm.   Procedure completion:  Tolerated well, no immediate complications     Medications Ordered in ED Medications  lidocaine-EPINEPHrine-tetracaine (LET) topical gel (3 mLs Topical Given 08/13/23 2056)  lactated ringers bolus 2,000 mL (0 mLs Intravenous Stopped 08/13/23 2310)    ED Course/ Medical Decision Making/ A&P                                 Medical Decision Making Amount and/or Complexity of Data Reviewed Labs: ordered. Radiology: ordered.   This patient presents to the ED for concern of syncope and fall, this involves an extensive number of treatment options,  and is a complaint that carries with it a high risk of complications and morbidity.  The differential diagnosis includes arrhythmia, dehydration, anemia, metabolic derangements, vasovagal episode, acute injuries   Co morbidities / Chronic conditions that complicate the patient evaluation  N/A   Additional history obtained:  Additional history obtained from EMR External records from outside source obtained and reviewed including N/A   Lab Tests:  I Ordered, and personally interpreted labs.  The pertinent results include: Normal hemoglobin, mild leukocytosis, normal kidney function, normal electrolytes, mild elevation in troponin   Imaging Studies ordered:  I ordered imaging studies including CT of head and cervical spine, chest x-ray I independently visualized and interpreted imaging which showed no acute intracranial or osseous findings.   I agree with the radiologist interpretation   Cardiac Monitoring: / EKG:  The patient was maintained on a cardiac monitor.  I personally viewed and interpreted the cardiac monitored which showed an underlying rhythm of: Sinus rhythm   Problem List / ED Course / Critical interventions / Medication management  Patient presents after syncopal episode.  He describes a prodrome of dizziness and nausea.  This was in the setting of not eating and drinking for the past 2 days.  On arrival in the ED, vital signs notable for moderate hypertension.  Patient is well-appearing on exam.  He has a 3 to 4 cm laceration to his left parietal scalp.  Let gel was applied.  There is some continued oozing from this wound.  Coban wrap was applied.  He has no focal neurologic deficits on exam.  His breathing is unlabored.  He is placed on cardiac monitor.  He has normal sinus rhythm and normal heart rate.  Workup was initiated.  While in monitor, patient had frequent tachycardia.  On the monitor, this appeared to be an irregular rhythm.  It appears to be sinus rhythm with  very frequent PACs.  This was persistent despite 2 L of IV fluid.  His lab work was notable for a mild elevation in troponin.  Given these findings, plan will be for admission.  His imaging studies did not show any acute osseous or intracranial findings.  His laceration was repaired with staples, as per procedure note above.  Patient was admitted for further management. I ordered medication including IV fluids for hydration; let gel for local anesthesia Reevaluation of the patient after these medicines showed that the patient improved I have reviewed the patients home medicines and have made adjustments as needed   Social Determinants of Health:  Lives independently        Final Clinical Impression(s) / ED Diagnoses Final diagnoses:  Tachycardia  Syncope and collapse  Laceration of scalp, initial encounter    Rx / DC Orders ED Discharge Orders     None         Iva Mariner, MD 08/13/23 2330

## 2023-08-14 ENCOUNTER — Observation Stay (HOSPITAL_COMMUNITY): Payer: Self-pay

## 2023-08-14 ENCOUNTER — Encounter (HOSPITAL_COMMUNITY): Payer: Self-pay | Admitting: Internal Medicine

## 2023-08-14 DIAGNOSIS — S0101XA Laceration without foreign body of scalp, initial encounter: Secondary | ICD-10-CM

## 2023-08-14 DIAGNOSIS — R7989 Other specified abnormal findings of blood chemistry: Secondary | ICD-10-CM | POA: Diagnosis present

## 2023-08-14 DIAGNOSIS — R55 Syncope and collapse: Secondary | ICD-10-CM | POA: Diagnosis present

## 2023-08-14 DIAGNOSIS — E860 Dehydration: Secondary | ICD-10-CM | POA: Diagnosis present

## 2023-08-14 DIAGNOSIS — Y92009 Unspecified place in unspecified non-institutional (private) residence as the place of occurrence of the external cause: Secondary | ICD-10-CM

## 2023-08-14 DIAGNOSIS — W19XXXA Unspecified fall, initial encounter: Secondary | ICD-10-CM

## 2023-08-14 DIAGNOSIS — D72829 Elevated white blood cell count, unspecified: Secondary | ICD-10-CM | POA: Diagnosis present

## 2023-08-14 LAB — COMPREHENSIVE METABOLIC PANEL WITH GFR
ALT: 42 U/L (ref 0–44)
AST: 36 U/L (ref 15–41)
Albumin: 3.9 g/dL (ref 3.5–5.0)
Alkaline Phosphatase: 55 U/L (ref 38–126)
Anion gap: 10 (ref 5–15)
BUN: 14 mg/dL (ref 6–20)
CO2: 22 mmol/L (ref 22–32)
Calcium: 8.7 mg/dL — ABNORMAL LOW (ref 8.9–10.3)
Chloride: 105 mmol/L (ref 98–111)
Creatinine, Ser: 1.05 mg/dL (ref 0.61–1.24)
GFR, Estimated: >60 mL/min (ref >60)
Glucose, Bld: 102 mg/dL — ABNORMAL HIGH (ref 70–99)
Potassium: 3.9 mmol/L (ref 3.5–5.1)
Sodium: 137 mmol/L (ref 135–145)
Total Bilirubin: 1.2 mg/dL (ref 0.0–1.2)
Total Protein: 6 g/dL — ABNORMAL LOW (ref 6.5–8.1)

## 2023-08-14 LAB — CBC WITH DIFFERENTIAL/PLATELET
Abs Immature Granulocytes: 0.03 10*3/uL (ref 0.00–0.07)
Basophils Absolute: 0 10*3/uL (ref 0.0–0.1)
Basophils Relative: 0 %
Eosinophils Absolute: 0.1 10*3/uL (ref 0.0–0.5)
Eosinophils Relative: 1 %
HCT: 42.2 % (ref 39.0–52.0)
Hemoglobin: 14.7 g/dL (ref 13.0–17.0)
Immature Granulocytes: 0 %
Lymphocytes Relative: 13 %
Lymphs Abs: 1.5 10*3/uL (ref 0.7–4.0)
MCH: 33.5 pg (ref 26.0–34.0)
MCHC: 34.8 g/dL (ref 30.0–36.0)
MCV: 96.1 fL (ref 80.0–100.0)
Monocytes Absolute: 1.1 10*3/uL — ABNORMAL HIGH (ref 0.1–1.0)
Monocytes Relative: 9 %
Neutro Abs: 9.2 10*3/uL — ABNORMAL HIGH (ref 1.7–7.7)
Neutrophils Relative %: 77 %
Platelets: 192 10*3/uL (ref 150–400)
RBC: 4.39 MIL/uL (ref 4.22–5.81)
RDW: 12.6 % (ref 11.5–15.5)
WBC: 12 10*3/uL — ABNORMAL HIGH (ref 4.0–10.5)
nRBC: 0 % (ref 0.0–0.2)

## 2023-08-14 LAB — TROPONIN I (HIGH SENSITIVITY): Troponin I (High Sensitivity): 34 ng/L — ABNORMAL HIGH

## 2023-08-14 LAB — MAGNESIUM: Magnesium: 1.8 mg/dL (ref 1.7–2.4)

## 2023-08-14 MED ORDER — ACETAMINOPHEN 650 MG RE SUPP
650.0000 mg | Freq: Four times a day (QID) | RECTAL | Status: DC | PRN
Start: 2023-08-14 — End: 2023-08-14

## 2023-08-14 MED ORDER — LACTATED RINGERS IV SOLN: INTRAVENOUS | Status: AC

## 2023-08-14 MED ORDER — ONDANSETRON HCL 4 MG/2ML IJ SOLN: 4.0000 mg | Freq: Four times a day (QID) | INTRAMUSCULAR | Status: DC | PRN

## 2023-08-14 MED ORDER — MELATONIN 3 MG PO TABS: 3.0000 mg | ORAL_TABLET | Freq: Every evening | ORAL | Status: DC | PRN

## 2023-08-14 MED ORDER — ACETAMINOPHEN 325 MG PO TABS
650.0000 mg | ORAL_TABLET | Freq: Four times a day (QID) | ORAL | Status: DC | PRN
Start: 2023-08-14 — End: 2023-08-14

## 2023-08-14 NOTE — Discharge Summary (Signed)
 Physician Discharge Summary   Patient: Erik Wilcox MRN: [id number] DOB: 06/09/1965  Admit date:     08/13/2023  Discharge date: 08/14/23  Discharge Physician: Erik Wilcox   PCP: Erik Wilcox   Recommendations at discharge:    Pt to be discharged home.   If you experience worsening fever, chills, chest pain, shortness of breath, or other concerning symptoms, please call your PCP or go to the emergency department immediately.  Discharge Diagnoses: Principal Problem:   Syncope Active Problems:   Fall at home, initial encounter   Elevated troponin   Leukocytosis   Dehydration  Resolved Problems:   * Wilcox resolved hospital problems. *   Hospital Course:  58 y.o. male who denies any significant past medical history, who is admitted to [facility] on 08/13/2023 with syncope after presenting from home to [facility] ED complaining of episode of loss of consciousness.   Assessment and Plan:  Syncope and collapse -Pt admitting to not eating for approximately 4 days secondary to being too busy at work.  Wilcox seizure activity.  Wilcox abnormalities observed on telemetry.  ECHO ordered but patient refused.  Pt ambulatory and eager for discharge home.  Instructed pt to follow with PCP in 1 week.  Elevated troponins -Likely demand ischemia given minimally elevated and flat.  Would advice pt to follow up with PCP to perhaps undergo cardiology referral in outpatient setting.  Scalp laceration -secondary to fall.  Continue daily dressing changes.  Monitor for signs of infection.      Consultants: none Procedures performed: none  Disposition: Home Diet recommendation:  Discharge Diet Orders (From admission, onward)     Start     Ordered   08/14/23 0000  Diet - low sodium heart healthy        08/14/23 0825           Cardiac diet  DISCHARGE MEDICATION: Allergies as of 08/14/2023       Reactions   Penicillins Other (See Comments)   unknown        Medication List     TAKE  these medications    acetaminophen  500 MG tablet Commonly known as: TYLENOL  Take 500 mg by mouth every 6 (six) hours as needed for mild pain (pain score 1-3) or moderate pain (pain score 4-6).               Discharge Care Instructions  (From admission, onward)           Start     Ordered   08/14/23 0000  Discharge wound care:       Comments: Change dressing daily.   08/14/23 0825             Discharge Exam: Erik Wilcox   08/13/23 2054  Weight: 99.8 kg    Physical Exam GENERAL:  Alert, pleasant, Wilcox acute distress  HEENT:  EOMI CARDIOVASCULAR:  RRR, Wilcox murmurs appreciated RESPIRATORY:  Clear to auscultation, Wilcox wheezing, rales, or rhonchi GASTROINTESTINAL:  Soft, nontender, nondistended EXTREMITIES:  Wilcox LE edema bilaterally NEURO:  Wilcox new focal deficits appreciated SKIN:  Wilcox rashes noted PSYCH:  Appropriate mood and affect     Condition at discharge: improving  The results of significant diagnostics from this hospitalization (including imaging, microbiology, ancillary and laboratory) are listed below for reference.   Imaging Studies: CT CERVICAL SPINE WO CONTRAST Result Date: 08/13/2023 CLINICAL DATA:  Neck trauma, dangerous injury mechanism (Age 71-64y) EXAM: CT CERVICAL SPINE WITHOUT CONTRAST TECHNIQUE: Multidetector CT  imaging of the cervical spine was performed without intravenous contrast. Multiplanar CT image reconstructions were also generated. RADIATION DOSE REDUCTION: This exam was performed according to the departmental dose-optimization program which includes automated exposure control, adjustment of the mA and/or kV according to patient size and/or use of iterative reconstruction technique. COMPARISON:  None Available. FINDINGS: Alignment: Normal. Skull base and vertebrae: Wilcox acute fracture. Vertebral body heights are maintained. Schmorl's nodes and inferior C3, superior and inferior C4, and inferior C5 endplates. The dens and skull base are  intact. Soft tissues and spinal canal: Wilcox prevertebral fluid or swelling. Wilcox visible canal hematoma. Disc levels: Disc space narrowing and spurring C2-C3, C3-C4, C4-C5 and C5-C6. Wilcox high-grade canal stenosis. Upper chest: Wilcox acute findings. Other: None. IMPRESSION: 1. Wilcox acute fracture or subluxation of the cervical spine. 2. Multilevel degenerative disc disease. Electronically Signed   By: Erik Colonel M.D.   On: 08/13/2023 22:48   CT HEAD WO CONTRAST Result Date: 08/13/2023 CLINICAL DATA:  Syncope and collapse. EXAM: CT HEAD WITHOUT CONTRAST TECHNIQUE: Contiguous axial images were obtained from the base of the skull through the vertex without intravenous contrast. RADIATION DOSE REDUCTION: This exam was performed according to the departmental dose-optimization program which includes automated exposure control, adjustment of the mA and/or kV according to patient size and/or use of iterative reconstruction technique. COMPARISON:  None Available. FINDINGS: Brain: Wilcox evidence of acute infarction, hemorrhage, hydrocephalus, extra-axial collection or mass lesion/mass effect. Vascular: Wilcox hyperdense vessel or unexpected calcification. Skull: Normal. Negative for fracture or focal lesion. Sinuses/Orbits: There is moderate to marked severity bilateral ethmoid sinus mucosal thickening. Mild right maxillary sinus mucosal thickening is also seen. Other: Mild left posterior parietal scalp soft tissue swelling is seen near the vertex. An associated small scalp soft tissue hematoma is noted. IMPRESSION: 1. Wilcox acute intracranial abnormality. 2. Mild left posterior parietal scalp soft tissue swelling and small scalp soft tissue hematoma. 3. Moderate to marked severity bilateral ethmoid sinus and mild right maxillary sinus disease. Electronically Signed   By: Erik Grime M.D.   On: 08/13/2023 22:16   DG Chest Port 1 View Result Date: 08/13/2023 CLINICAL DATA:  Status post fall EXAM: PORTABLE CHEST 1 VIEW COMPARISON:   None Available. FINDINGS: Normal cardiomediastinal silhouette. Wilcox focal consolidation, pleural effusion, or pneumothorax. Wilcox displaced rib fractures. IMPRESSION: Wilcox acute cardiopulmonary disease. Electronically Signed   By: Rozell Cornet M.D.   On: 08/13/2023 21:13    Microbiology: Wilcox results found for this or any previous visit.  Labs: CBC: Recent Labs  Lab 08/13/23 2101 08/14/23 0407  WBC 12.5* 12.0*  NEUTROABS 10.3* 9.2*  HGB 15.6 14.7  HCT 45.0 42.2  MCV 95.7 96.1  PLT 209 192   Basic Metabolic Panel: Recent Labs  Lab 08/13/23 2101 08/14/23 0407  NA 137 137  K 4.1 3.9  CL 102 105  CO2 22 22  GLUCOSE 145* 102*  BUN 19 14  CREATININE 1.10 1.05  CALCIUM 9.5 8.7*  MG 1.9 1.8   Liver Function Tests: Recent Labs  Lab 08/14/23 0407  AST 36  ALT 42  ALKPHOS 55  BILITOT 1.2  PROT 6.0*  ALBUMIN 3.9   CBG: Wilcox results for input(s): "GLUCAP" in the last 168 hours.  Discharge time spent: 35 minutes.  Length of stay: 0 days  Signed: Jodeane Mulligan, DO [facility] 08/14/2023

## 2023-08-14 NOTE — H&P (Signed)
 History and Physical      Erik Wilcox ZOX:[id number] DOB: 1965/09/18 DOA: 08/13/2023; DOS: 08/14/2023  PCP: Pcp, No  (will further assess) Patient coming from: home   I have personally briefly reviewed patient's old medical records in [facility]  Chief Complaint: Episode of loss of consciousness  HPI: Erik Wilcox is a 58 y.o. male who denies any significant past medical history, who is admitted to [facility] on 08/13/2023 with syncope after presenting from home to [facility] ED complaining of episode of loss of consciousness.   The patient reports a single episode of loss of consciousness that occurred after eating lunch on 08/13/2023.  This episode occurred as the patient attempted to rise from a seated to a standing position at the restaurant, at which time he developed acute dizziness, lightheadedness, as well as the subjective sensation of impending loss of consciousness, before formally losing consciousness and falling backwards, with observers noting that the patient's track the posterior aspect of his head on the floor below.  Per count of these witnesses, the patient remained unconscious for a few seconds before regaining consciousness, without exhibiting any evidence of or interval evidence of tonic-clonic activity.  The episode of episode was not associated with any tongue biting or any loss of bowel/bladder function.  Patient denies any associated acute focal weakness or any acute focal numbness, paresthesias.  Denies any new onset headache and or any new onset neck discomfort.  Furthermore, he denies any acute arthralgias or myalgias as a consequence of this fall/episode of loss of consciousness.  He is not on any blood thinners as an outpatient, including no aspirin.  As it relates to syncope, the patient denies any recent, immediately preceding, or ensuing chest pain, sob, palpitations, diaphoresis, nausea, vomiting dizziness.  Denies any prior history of syncopal  event.    However, he does note that he has experienced some diminished oral intake over the 2 days leading up to this episode.  No recent cough, wheezing, or hemoptysis.  Denies any prior medical diagnoses, including no history of hypertension.  Not on any antihypertensive medications or any diuretic medications as an outpatient.  Per chart review, no prior echocardiogram on file.    ED Course:  Vital signs in the ED were notable for the following: Afebrile; heart rates in the low 100s initially, which decreased into the 80s to 90s following interval IV fluids, as further quantified below.  Systolic blood pressures in the 140s to 150s; respiratory rate 15-20, oxygen saturation 95 to 99% on room air.  Labs were notable for the following: BMP was notable for the following: Sodium 137, potassium 4.1, carbon 22 creatinine 1.1, glucose 145.  Magnesium level 1.9.  Initial high sensitive troponin I was 31, with repeat value trending up slightly to 34, without any prior high-sensitivity troponin I data points available for reported comparison.  CBC notable for white blood cell count 12,500 with hemoglobin 15.6.  Per my interpretation, EKG in ED demonstrated the following: Sinus tachycardia with rate, branch block, heart rate 1-2, T wave inversions in V3, V4, V5, without any prior EKG available for reported comparison, will demonstrate evidence of ST changes, including no evidence of ST elevation.  Imaging in the ED, per corresponding formal radiology read, was notable for the following: 1 view chest x-ray showed no evidence of acute cardiopulmonary process, including no evidence of infiltrate, edema, effusion, or pneumothorax.  CT head, without contrast, showed no evidence of acute intracranial process, no evidence  of intracranial hemorrhage or evidence of acute infarct.  CT cervical spine showed evidence of acute cervical spine fracture subluxation injury.  Scalp plaque noted on the posterior aspect of  the patient's head, for which closure was subsequently obtained with proceeding I again epinephrine  topical gel.  Additionally, while in the ED, the following were administered: Lactated Ringer 's x 2 L bolus.  Subsequently, the patient was admitted for overnight observation for single episode of syncope.     Review of Systems: As per HPI otherwise 10 point review of systems negative.   History reviewed. No pertinent past medical history.  History reviewed. No pertinent surgical history.  Social History:  reports that he is a non-smoker but has been exposed to tobacco smoke. He has never used smokeless tobacco. He reports current alcohol use. He reports that he does not use drugs.   Allergies  Allergen Reactions   Penicillins Other (See Comments)    unknown    History reviewed. No pertinent family history.   Prior to Admission medications   Medication Sig Start Date End Date Taking? Authorizing Provider  aspirin 81 MG tablet Take 81 mg by mouth daily.    [provider]  cyclobenzaprine  (FLEXERIL ) 10 MG tablet Take 1 tablet (10 mg total) by mouth 3 (three) times daily as needed for muscle spasms. 06/11/16   Sylvan Evener, MD  HYDROcodone -acetaminophen  (NORCO) 5-325 MG tablet Take 1 tablet by mouth every 6 (six) hours as needed for moderate pain. Patient not taking: Reported on 09/08/2017 06/11/16   Sylvan Evener, MD  meloxicam (MOBIC) 15 MG tablet Take 15 mg by mouth daily.    [provider]  predniSONE  (DELTASONE ) 10 MG tablet Take in tapering course as directed 6-5-4-3-2-1 06/11/16   Sylvan Evener, MD     Objective    Physical Exam: Vitals:   08/13/23 2048 08/13/23 2054 08/13/23 2315 08/14/23 0000  BP:  (!) 154/94 (!) 149/88   Pulse:  93 (!) 102 (!) 102  Resp:  12 16 20   Temp:  98.3 F (36.8 C)    TempSrc:  Oral    SpO2: 96% 98% 98% 99%  Weight:  99.8 kg    Height:  5\' 11"  (1.803 m)      General: appears to be stated age; alert,  oriented Skin: warm, dry, no rash;  Head: Scalp lack on posterior aspect, associated hemostasis; otherwise, AT/Standing [place];  Mouth:  Oral mucosa membranes appear dry, normal dentition Neck: supple; trachea midline Heart:  RRR; did not appreciate any M/R/G Lungs: CTAB, did not appreciate any wheezes, rales, or rhonchi Abdomen: + BS; soft, ND, NT Vascular: 2+ pedal pulses b/l; 2+ radial pulses b/l Extremities: no peripheral edema, no muscle wasting Neuro: strength and sensation intact in upper and lower extremities b/l   Labs on Admission: I have personally reviewed following labs and imaging studies  CBC: Recent Labs  Lab 08/13/23 2101  WBC 12.5*  NEUTROABS 10.3*  HGB 15.6  HCT 45.0  MCV 95.7  PLT 209   Basic Metabolic Panel: Recent Labs  Lab 08/13/23 2101  NA 137  K 4.1  CL 102  CO2 22  GLUCOSE 145*  BUN 19  CREATININE 1.10  CALCIUM 9.5  MG 1.9   GFR: Estimated Creatinine Clearance: 87 mL/min (by C-G formula based on SCr of 1.1 mg/dL). Liver Function Tests: No results for input(s): "AST", "ALT", "ALKPHOS", "BILITOT", "PROT", "ALBUMIN" in the last 168 hours. No results for input(s): "LIPASE", "AMYLASE" in the  last 168 hours. No results for input(s): "AMMONIA" in the last 168 hours. Coagulation Profile: No results for input(s): "INR", "PROTIME" in the last 168 hours. Cardiac Enzymes: No results for input(s): "CKTOTAL", "CKMB", "CKMBINDEX", "TROPONINI" in the last 168 hours. BNP (last 3 results) No results for input(s): "PROBNP" in the last 8760 hours. HbA1C: No results for input(s): "HGBA1C" in the last 72 hours. CBG: No results for input(s): "GLUCAP" in the last 168 hours. Lipid Profile: No results for input(s): "CHOL", "HDL", "LDLCALC", "TRIG", "CHOLHDL", "LDLDIRECT" in the last 72 hours. Thyroid Function Tests: No results for input(s): "TSH", "T4TOTAL", "FREET4", "T3FREE", "THYROIDAB" in the last 72 hours. Anemia Panel: No results for input(s): "VITAMINB12",  "FOLATE", "FERRITIN", "TIBC", "IRON", "RETICCTPCT" in the last 72 hours. Urine analysis:    Component Value Date/Time   BILIRUBINUR neg 09/08/2017 1101   PROTEINUR Negative 09/08/2017 1101   UROBILINOGEN 0.2 09/08/2017 1101   NITRITE neg 09/08/2017 1101   LEUKOCYTESUR Negative 09/08/2017 1101    Radiological Exams on Admission: CT CERVICAL SPINE WO CONTRAST Result Date: 08/13/2023 CLINICAL DATA:  Neck trauma, dangerous injury mechanism (Age 71-64y) EXAM: CT CERVICAL SPINE WITHOUT CONTRAST TECHNIQUE: Multidetector CT imaging of the cervical spine was performed without intravenous contrast. Multiplanar CT image reconstructions were also generated. RADIATION DOSE REDUCTION: This exam was performed according to the departmental dose-optimization program which includes automated exposure control, adjustment of the mA and/or kV according to patient size and/or use of iterative reconstruction technique. COMPARISON:  None Available. FINDINGS: Alignment: Normal. Skull base and vertebrae: No acute fracture. Vertebral body heights are maintained. Schmorl's nodes and inferior C3, superior and inferior C4, and inferior C5 endplates. The dens and skull base are intact. Soft tissues and spinal canal: No prevertebral fluid or swelling. No visible canal hematoma. Disc levels: Disc space narrowing and spurring C2-C3, C3-C4, C4-C5 and C5-C6. No high-grade canal stenosis. Upper chest: No acute findings. Other: None. IMPRESSION: 1. No acute fracture or subluxation of the cervical spine. 2. Multilevel degenerative disc disease. Electronically Signed   By: Chadwick Colonel M.D.   On: 08/13/2023 22:48   CT HEAD WO CONTRAST Result Date: 08/13/2023 CLINICAL DATA:  Syncope and collapse. EXAM: CT HEAD WITHOUT CONTRAST TECHNIQUE: Contiguous axial images were obtained from the base of the skull through the vertex without intravenous contrast. RADIATION DOSE REDUCTION: This exam was performed according to the departmental  dose-optimization program which includes automated exposure control, adjustment of the mA and/or kV according to patient size and/or use of iterative reconstruction technique. COMPARISON:  None Available. FINDINGS: Brain: No evidence of acute infarction, hemorrhage, hydrocephalus, extra-axial collection or mass lesion/mass effect. Vascular: No hyperdense vessel or unexpected calcification. Skull: Normal. Negative for fracture or focal lesion. Sinuses/Orbits: There is moderate to marked severity bilateral ethmoid sinus mucosal thickening. Mild right maxillary sinus mucosal thickening is also seen. Other: Mild left posterior parietal scalp soft tissue swelling is seen near the vertex. An associated small scalp soft tissue hematoma is noted. IMPRESSION: 1. No acute intracranial abnormality. 2. Mild left posterior parietal scalp soft tissue swelling and small scalp soft tissue hematoma. 3. Moderate to marked severity bilateral ethmoid sinus and mild right maxillary sinus disease. Electronically Signed   By: Virgle Grime M.D.   On: 08/13/2023 22:16   DG Chest Port 1 View Result Date: 08/13/2023 CLINICAL DATA:  Status post fall EXAM: PORTABLE CHEST 1 VIEW COMPARISON:  None Available. FINDINGS: Normal cardiomediastinal silhouette. No focal consolidation, pleural effusion, or pneumothorax. No displaced rib fractures. IMPRESSION: No  acute cardiopulmonary disease. Electronically Signed   By: Rozell Cornet M.D.   On: 08/13/2023 21:13      Assessment/Plan   Principal Problem:   Syncope Active Problems:   Fall at home, initial encounter   Elevated troponin   Leukocytosis   Dehydration     #) Syncope: 1 episode of syncope that occurred when the patient was moving from a seated to standing position and associated with prodrome, suggestive of potential orthostatic hypotension in the setting of dehydration given report of recent decrease in oral intake of both food and water over the course of the  preceding 48 hours, as above, which appears consistent with improvement in initial sinus tachycardia with IV fluids as well as initial appearance of dry oral mucous membranes.  Will check orthostatic vital signs, but with the caveat that the patient has already received IVF's in the ED, potentially altering the results of this evaluation.    Not associated with any overt acute focal neurologic deficits. Clinically, acute ischemic stroke versus seizures appear less likely at this time, will noting that CT head showed no evidence of acute intracranial process. Presentation appears less consistent with ACS at this time, with presenting EKG showing no evidence of acute ischemic changes, and the absence of any associated CP.  There is very mild initial elevation in high sensitive troponin I 31, with repeat value trending up slightly to 34.  Suspect that this is more on the basis of supply/demand mismatch in the setting of generalized systemic hypoperfusion resulting from a noncardiogenic source that led to his syncopal episode.  However, we will continue to trend troponin and pursue echocardiogram in the morning.  Clinically, acute pulmonary embolism appears to be less likely at this time.  In setting of associated prodrome, the possibility of ventricular arrhythmia appears to be less likely at this time, although will closely monitor on telemetry for any e/o potential contributory arrhythmia, while also trending serum Mg level.   No overt pharmacologic contributions in terms of outpatient medications.    Plan: I have placed a nursing communication order requesting that orthostatic vital signs x 1 set be checked and documented, following which will initiate gentle IVF's in the form of lactated Ringer's at 75 cc/h x 8 hours. Monitor on telemetry. Monitor strict I's and O's.  Recheck serum magnesium level in the morning.  Check CMP, CBC. Fall precautions ordered. Trend trop.  Echocardiogram in the  morning.                   #)  ground-level fall: As a consequence of a single syncopal episode when moving from a seated to standing position.  While the patient did hit his head as a component of this fall, he is not on any blood thinners, and CT head shows no evidence of acute intracranial process, including evidence of intracranial hemorrhage, will noting a small posterior scalp hematoma with hemostasis noted on exam.  Additionally, CT cervical spine showed evidence of acute cervical spine fracture subluxation injury.  Otherwise, no evidence of any acute arthralgias or myalgias as consequence of this fall.  Plan: Further evaluation management of presenting single episode of syncope, asthma.  Will precautions ordered.  Check urinalysis.                       #) Leukocytosis: Presenting CBC reflects mildly elevated white cell count of 12,500. Suspect an element of hemoconcentration in the setting of clinical evidence of dehydration,  particularly in the setting of patient's report of recent decline in oral intake over the last 48 hours. This may also be reactive in nature in the setting of presenting syncopal episode with ground-level fall. No evidence to suggest underlying infectious process at this time, including chest x-ray, which showed no evidence of infiltrate. Will further assess with UA.  At this time, in the absence of underlying source of infection, criteria for sepsis are not currently met.  He appears hemodynamically stable.  Therefore, we will refrain from initiation of antibiotics at this time.   Plan: Repeat CBC with diff in the morning.  Monitor strict I's and O's, daily weights.  Check urinalysis.  IV fluids, as above.                            #) Dehydration: Clinical suspicion for such, including the appearance of dry oral mucous membranes as well as noting resolution of sinus tachycardia with IV fluids. appears to be in  the setting of   patient's reported recent decline in oral intake over the last 2 days.  No e/o associated systemic hypotension.  However, suspect a contribution from orthostatic hypotension towards his presenting episode of postural syncope.  Will check orthostatic vital signs to further assess.  Of note, he has received 2 L of IV fluid in the ED thus far.   Plan: Monitor strict I's and O's.  Daily weights.  CMP in the morning.  Check orthostatic vital signs followed by IVF's in form of lactated Ringer's at 75 cc/h x 8 hours.       DVT prophylaxis: SCD's   Code Status: Full code Family Communication: none Disposition Plan: Per Rounding Team Consults called: none;  Admission status: Observation     I SPENT GREATER THAN 75  MINUTES IN CLINICAL CARE TIME/MEDICAL DECISION-MAKING IN COMPLETING THIS ADMISSION.      Gattis Kass Gwendelyn Lanting DO [facility]  From 7PM - 7AM   08/14/2023, 12:24 AM

## 2023-08-15 ENCOUNTER — Other Ambulatory Visit: Payer: Self-pay

## 2023-08-15 ENCOUNTER — Emergency Department (HOSPITAL_COMMUNITY)
Admission: EM | Admit: 2023-08-15 | Discharge: 2023-08-17 | Disposition: A | Discharge status: Other Acute Inpatient Hospital | Payer: Self-pay | Attending: Emergency Medicine | Admitting: Emergency Medicine

## 2023-08-15 ENCOUNTER — Emergency Department (HOSPITAL_COMMUNITY): Payer: Self-pay

## 2023-08-15 DIAGNOSIS — Z7722 Contact with and (suspected) exposure to environmental tobacco smoke (acute) (chronic): Secondary | ICD-10-CM | POA: Insufficient documentation

## 2023-08-15 DIAGNOSIS — F23 Brief psychotic disorder: Secondary | ICD-10-CM | POA: Insufficient documentation

## 2023-08-15 DIAGNOSIS — F29 Unspecified psychosis not due to a substance or known physiological condition: Secondary | ICD-10-CM

## 2023-08-15 DIAGNOSIS — R4689 Other symptoms and signs involving appearance and behavior: Secondary | ICD-10-CM

## 2023-08-15 LAB — COMPREHENSIVE METABOLIC PANEL WITH GFR
ALT: 39 U/L (ref 0–44)
AST: 40 U/L (ref 15–41)
Albumin: 4.5 g/dL (ref 3.5–5.0)
Alkaline Phosphatase: 51 U/L (ref 38–126)
Anion gap: 15 (ref 5–15)
BUN: 21 mg/dL — ABNORMAL HIGH (ref 6–20)
CO2: 19 mmol/L — ABNORMAL LOW (ref 22–32)
Calcium: 9.4 mg/dL (ref 8.9–10.3)
Chloride: 107 mmol/L (ref 98–111)
Creatinine, Ser: 1.15 mg/dL (ref 0.61–1.24)
GFR, Estimated: >60 mL/min (ref >60)
Glucose, Bld: 108 mg/dL — ABNORMAL HIGH (ref 70–99)
Potassium: 3.9 mmol/L (ref 3.5–5.1)
Sodium: 141 mmol/L (ref 135–145)
Total Bilirubin: 1.8 mg/dL — ABNORMAL HIGH (ref 0.0–1.2)
Total Protein: 6.8 g/dL (ref 6.5–8.1)

## 2023-08-15 LAB — CBC WITH DIFFERENTIAL/PLATELET
Abs Immature Granulocytes: 0.05 10*3/uL (ref 0.00–0.07)
Basophils Absolute: 0 10*3/uL (ref 0.0–0.1)
Basophils Relative: 0 %
Eosinophils Absolute: 0.2 10*3/uL (ref 0.0–0.5)
Eosinophils Relative: 2 %
HCT: 43.9 % (ref 39.0–52.0)
Hemoglobin: 14.7 g/dL (ref 13.0–17.0)
Immature Granulocytes: 0 %
Lymphocytes Relative: 12 %
Lymphs Abs: 1.5 10*3/uL (ref 0.7–4.0)
MCH: 33.3 pg (ref 26.0–34.0)
MCHC: 33.5 g/dL (ref 30.0–36.0)
MCV: 99.3 fL (ref 80.0–100.0)
Monocytes Absolute: 1.1 10*3/uL — ABNORMAL HIGH (ref 0.1–1.0)
Monocytes Relative: 9 %
Neutro Abs: 9.3 10*3/uL — ABNORMAL HIGH (ref 1.7–7.7)
Neutrophils Relative %: 77 %
Platelets: 212 10*3/uL (ref 150–400)
RBC: 4.42 MIL/uL (ref 4.22–5.81)
RDW: 12.8 % (ref 11.5–15.5)
WBC: 12.1 10*3/uL — ABNORMAL HIGH (ref 4.0–10.5)
nRBC: 0 % (ref 0.0–0.2)

## 2023-08-15 LAB — ETHANOL: Alcohol, Ethyl (B): <15 mg/dL (ref <15)

## 2023-08-15 LAB — ACETAMINOPHEN LEVEL: Acetaminophen (Tylenol), Serum: <10 ug/mL — ABNORMAL LOW (ref 10–30)

## 2023-08-15 LAB — SALICYLATE LEVEL: Salicylate Lvl: <7 mg/dL — ABNORMAL LOW (ref 7.0–30.0)

## 2023-08-15 MED ORDER — DIPHENHYDRAMINE HCL 50 MG/ML IJ SOLN: 25.0000 mg | Freq: Once | INTRAMUSCULAR | Status: DC

## 2023-08-15 MED ORDER — DROPERIDOL 2.5 MG/ML IJ SOLN: 2.5000 mg | Freq: Once | INTRAMUSCULAR | Status: DC

## 2023-08-15 MED ORDER — OLANZAPINE 5 MG PO TBDP
5.0000 mg | ORAL_TABLET | Freq: Once | ORAL | Status: DC
  Filled 2023-08-15: qty 1

## 2023-08-15 MED ORDER — DIPHENHYDRAMINE HCL 50 MG/ML IJ SOLN
50.0000 mg | Freq: Once | INTRAMUSCULAR | Status: AC
  Administered 2023-08-15: 50 mg via INTRAMUSCULAR
  Filled 2023-08-15: qty 1

## 2023-08-15 MED ORDER — MIDAZOLAM HCL (PF) 10 MG/2ML IJ SOLN
5.0000 mg | Freq: Once | INTRAMUSCULAR | Status: AC
  Administered 2023-08-15: 5 mg via INTRAMUSCULAR
  Filled 2023-08-15 (×2): qty 2

## 2023-08-15 MED ORDER — DROPERIDOL 2.5 MG/ML IJ SOLN
2.5000 mg | Freq: Once | INTRAMUSCULAR | Status: AC
  Administered 2023-08-15: 2.5 mg via INTRAMUSCULAR
  Filled 2023-08-15: qty 2
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
## 2023-08-15 NOTE — BH Assessment (Signed)
 5/30: TTS attempted to complete assessment, RN notified via secure chat that the patient is currently in restraints and medicated. Advised RN to reach out when patient is no longer in restraints and oriented to participate in the assessment.

## 2023-08-15 NOTE — ED Notes (Signed)
 Pt in bed, pt is agitated, pt knows his name and where he is, pt doesn't know day of the week or month, knows year and president.  PD with pt and found pt wandering around.

## 2023-08-15 NOTE — ED Notes (Signed)
 Pt remains argumentative, pt refuses zyprexa
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
## 2023-08-15 NOTE — ED Notes (Signed)
 Rec'ed patient at 1900 in to introduce self. 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 Patient is calm non threatening at this time. Patient refusing medication. Safety maintained 1:1 within arms reach security/LOE at patient room.

## 2023-08-15 NOTE — ED Notes (Signed)
 Ed techs at bedside attempting to change pt into hospital scrubs, pt remains argumentative, states that he will not comply until he sees his IVC paperwork, security at bedside, pt calling ed tech stupid.
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
## 2023-08-15 NOTE — ED Notes (Signed)
 IVC in process, Envelope # [id number]

## 2023-08-15 NOTE — ED Notes (Signed)
 IVC paperwork complete and in blue zone, expires 08/22/23, case # [id number]

## 2023-08-16 ENCOUNTER — Encounter (HOSPITAL_COMMUNITY): Payer: Self-pay

## 2023-08-16 MED ORDER — HALOPERIDOL LACTATE 5 MG/ML IJ SOLN: 5.0000 mg | Freq: Four times a day (QID) | INTRAMUSCULAR | Status: DC | PRN

## 2023-08-16 MED ORDER — LORAZEPAM 2 MG/ML IJ SOLN: 2.0000 mg | Freq: Four times a day (QID) | INTRAMUSCULAR | Status: DC | PRN

## 2023-08-16 MED ORDER — OLANZAPINE 5 MG PO TABS
5.0000 mg | ORAL_TABLET | Freq: Every day | ORAL | Status: DC
  Administered 2023-08-16: 5 mg via ORAL
  Filled 2023-08-16: qty 1

## 2023-08-16 MED ORDER — HALOPERIDOL 5 MG PO TABS
5.0000 mg | ORAL_TABLET | Freq: Four times a day (QID) | ORAL | Status: DC | PRN
  Administered 2023-08-16 (×2): 5 mg via ORAL
  Filled 2023-08-16 (×2): qty 1

## 2023-08-16 MED ORDER — LORAZEPAM 1 MG PO TABS
2.0000 mg | ORAL_TABLET | Freq: Four times a day (QID) | ORAL | Status: DC | PRN
  Administered 2023-08-16 (×2): 2 mg via ORAL
  Filled 2023-08-16 (×2): qty 2

## 2023-08-16 NOTE — ED Notes (Signed)
 Patient being screened at this time.

## 2023-08-16 NOTE — ED Notes (Signed)
 Patient coming out of room stating he wants his clothes so when he is discharged he can go home. He stated his fiance is on the way. He is In room talking about how he was supposed to be discharged an hour ago.  Ac

## 2023-08-16 NOTE — Progress Notes (Signed)
 [facility] LCSW Progress Note   08/16/2023    1:07 PM  Erik Wilcox   [id number]   Type of Contact and Topic:  Psychiatric Bed Placement   Pt accepted to [facility]   Patient meets inpatient criteria per Roise Cleaver, NP  The attending provider will be Dr. Allan Ishihara  Call report to [phone]  Alejo Hurter, RN @ [facility] ED notified.     Pt scheduled  to arrive at [facility] for today.   Phares Brasher, MSW, LCSW-A  1:08 PM 08/16/2023

## 2023-08-16 NOTE — ED Notes (Signed)
 Ivc status check and current

## 2023-08-16 NOTE — ED Notes (Signed)
 Patient attempting to leave the unit. Redirected by security to return to room. Patient standing in the door of room.

## 2023-08-16 NOTE — ED Notes (Signed)
 Patient tried to walk out of the purple pod, patient was redirected by security and this RN. Patient appeared confused as to what was going on. RN asked patient questions about where he was, the month and why he was at the hospital. He was unable to answer any of the questions. Patient doesn't remember being here this morning. RN asked patient if he wanted to lay down and rest and patient stated he needed to stand.

## 2023-08-16 NOTE — Consult Note (Signed)
 Pt seen for reevaluation by this provider. He is alert and oriented x4, does not appear confused or delusional. He does mention he does not remember hardly anything since being d/c from ED after a fall with head contusion on 08/13/23. Pt denies SI/HI/AVH. He has no previous psych hx. He denies any substance use, UDS is pending.   Ultimately, his disposition is unable to be accurately confirmed due to not being able to get in touch with his wife. Ideally, would appreciate the wife coming to see the patient today to ensure he is returning to baseline. Voicemail was left for the wife. Will have detailed consult note with disposition once collateral with wife is obtained.

## 2023-08-16 NOTE — ED Notes (Signed)
 Patient sitting in the floor outside of the room. Sitter offered a chair and he did not want one.

## 2023-08-16 NOTE — ED Notes (Signed)
EDP [place] at bedside 

## 2023-08-16 NOTE — Progress Notes (Signed)
 Patient has been denied by [facility] due to no appropriate beds available. Patient meets [facility] inpatient criteria per Roise Cleaver, NP. Patient has been faxed out to the following facilities:   [facility] [address] [place] [place] [address] [phone]  [facility] [address] [place] [address] [phone]  [facility] [place] [address] [place], [place] [address] [phone]  [facility] Patient Placement [facility], [place] [phone]  [facility] [address] [place] [address] [phone]  [facility] [address] [place] [address] [phone]  [facility] EFAX [address] [place], [place] [phone]  [facility] [address] [place]ella Naas [place] [address] [phone]  [facility] [address], [place] [address] [phone]  [facility] [address] [place] [address] [phone]  [facility] [address] [place], [place] [address] [phone]  [facility] [address] [place] [address] [phone]  [facility] [address], [place] [address] [phone]  [facility] [address] [place] [address] [phone]  [facility] [address], [place] [address] [phone]  [facility] [address] [place] [address] [phone]   Phares Brasher, MSW, LCSW-A  11:34 AM 08/16/2023

## 2023-08-16 NOTE — ED Notes (Addendum)
 Patient assisted to 67 via wheelchair with all belongings report given.

## 2023-08-16 NOTE — ED Notes (Signed)
 Patient continuing to try and walk out of unit. Patients redirected multiple times. PRN medication given. See mar.

## 2023-08-16 NOTE — BH Assessment (Signed)
 Comprehensive Clinical Assessment (CCA) Note  08/16/2023 Erik Wilcox [id number]  Chief Complaint:  Chief Complaint  Patient presents with   Psychiatric Evaluation   Disposition: Per Per Erik Wilcox patient is recommended for inpatient admission.  Disposition SW to pursue appropriate inpatient options.  The patient demonstrates the following risk factors for suicide: Chronic risk factors for suicide include: N/A. Acute risk factors for suicide include: N/A. Protective factors for this patient include: positive social support. Considering these factors, the overall suicide risk at this point appears to be low. Patient is not appropriate for outpatient follow up.   Erik Wilcox is a 58 year old male who presents to [facility] under IVC.Per IVC " Patient with paranoid delusions, walking outside of the restaurant telling patrons they had to stay inside the restaurant because there was an active shooter. Talking into a tin can that he reported was a walkie-talkie. Showed up at the Police department reporting he was meeting with a confidential informant who did not exist. Has not slept in days."  Patient resides in the home with his fianceConcha Wilcox per his report. Patient reports irritability, hopelessness, and decreased sleep. Patient is a poor historian and cannot recall exactly what happened prior to his arrival to the hospital. Patient appears to be confused, disoriented, and reports feelings drowsy. He denies any recent stressors and states he doesn't remember why the police brought him to the hospital. 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 Per chart review patient experienced a syncope and a head injury on 5/28 and his wife reported him missing because he had not been home for multiple days.Prior to the assessment nursing staff reported that the patient was experiencing increased aggression, non compliance and eventually  had to be restrained. Per last RN note "Rec'ed patient at 1900 in to introduce self. 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 Patient is calm non threatening at this time. Patient refusing medication. Safety maintained 1:1 within arms reach security/LOE at patient room." Patient states he is unable to identify stressors in his life or recent changes that may have caused a behavior change. Patient denies mental health history, denies previous inpatient admissions. He denies access to weapons. He denies current legal issues. He denies hx of abuse or trauma.Patient currently denies NSSIB, SI, HI,paranoia, substance abuse and AVH.   Treatment options were discussed and patient is in agreement with recommendation for inpatient admission.       Visit Diagnosis:  Altered mental state    CCA Screening, Triage and Referral (STR)  Patient Reported Information How did you hear about us ? Legal System  What Is the Reason for Your Visit/Call Today? Erik Wilcox is a 58 year old male who presents to [facility] under IVC.Per IVC " Patient with paranoid delusions, walking outside of the restaurant telling patrons they had to stay inside the restaurant because there was an active shooter. Talking into a tin can that he reported was a walkie-talkie. Showed up at the Police department reporting he was meeting with a confidential informant who did not exist. Has not slept in days."  Pt denies SI/HI and AVH.  How Long Has This Been Causing You Problems? <Week  What Do You  Feel Would Help You the Most Today? Treatment for Depression or other mood problem   Have You Recently Had Any Thoughts About Hurting Yourself? No  Are You Planning to Commit Suicide/Harm  Yourself At This time? No   Flowsheet Row ED from 08/15/2023 in [facility] Emergency Department at [facility] ED from 08/13/2023 in [facility] Emergency Department at [facility]  C-SSRS RISK CATEGORY No Risk No Risk       Have you Recently Had Thoughts About Hurting Someone Erik Wilcox? No  Are You Planning to Harm Someone at This Time? No  Explanation: denies SI/HI   Have You Used Any Alcohol or Drugs in the Past 24 Hours? No  How Long Ago Did You Use Drugs or Alcohol? N/A  What Did You Use and How Much? N/A  Do You Currently Have a Therapist/Psychiatrist? No  Name of Therapist/Psychiatrist:    Have You Been Recently Discharged From Any Office Practice or Programs? No  Explanation of Discharge From Practice/Program: N/A    CCA Screening Triage Referral Assessment Type of Contact: Tele-Assessment  Telemedicine Service Delivery: Telemedicine service delivery: This service was provided via telemedicine using a 2-way, interactive audio and video technology  Is this Initial or Reassessment? Is this Initial or Reassessment?: Initial Assessment  Date Telepsych consult ordered in [facility]:  Date Telepsych consult ordered in [facility]: 08/15/23  Time Telepsych consult ordered in [facility]:  Time Telepsych consult ordered in [facility]: 1941  Location of Assessment: [facility] ED  Provider Location: [facility] Assessment Services   Collateral Involvement: IVC paperwork   Does Patient Have a Automotive engineer Guardian? No  Legal Guardian Contact Information: n/a  Copy of Legal Guardianship Form: -- (n/a)  Legal Guardian Notified of Arrival: -- (n/a)  Legal Guardian Notified of Pending Discharge: -- (n/a)  If Minor and Not Living with Parent(s), Who has Custody? n/a  Is CPS involved or ever been involved? Never  Is APS involved or ever been involved? Never   Patient Determined To Be At Risk for Harm To Self or Others Based on Review of Patient Reported Information or Presenting  Complaint? No  Method: No Plan  Availability of Means: No access or NA  Intent: Vague intent or NA  Notification Required: No need or identified person  Additional Information for Danger to Others Potential: -- (n/a)  Additional Comments for Danger to Others Potential: n/a  Are There Guns or Other Weapons in Your Home? No  Types of Guns/Weapons: denies access to weapons  Are These Weapons Safely Secured?                            -- (denies access to weapons)  Who Could Verify You Are Able To Have These Secured: denies access to weapons  Do You Have any Outstanding Charges, Pending Court Dates, Parole/Probation? denies  Contacted To Inform of Risk of Harm To Self or Others: Law Enforcement    Does Patient Present under Involuntary Commitment? Yes    [place] of Residence: [place]   Patient Currently Receiving the Following Services: Not Receiving Services   Determination of Need: Urgent (48 hours)   Options For Referral: Inpatient Hospitalization     CCA Biopsychosocial Patient Reported Schizophrenia/Schizoaffective Diagnosis in Past: No   Strengths: Cooperation in assessment   Mental Health Symptoms Depression:  Irritability; Sleep (too much or little); Hopelessness   Duration of Depressive symptoms: Duration of Depressive Symptoms: Less than two  weeks   Mania:  N/A   Anxiety:   Tension   Psychosis:  None   Duration of Psychotic symptoms:    Trauma:  N/A   Obsessions:  N/A   Compulsions:  N/A   Inattention:  N/A   Hyperactivity/Impulsivity:  N/A   Oppositional/Defiant Behaviors:  N/A   Emotional Irregularity:  N/A   Other Mood/Personality Symptoms:  none reported    Mental Status Exam Appearance and self-care  Stature:  Average   Weight:  Overweight   Clothing:  -- (wearing scrubs)   Grooming:  Normal   Cosmetic use:  None   Posture/gait:  Tense   Motor activity:  Slowed   Sensorium  Attention:  Confused    Concentration:  Normal   Orientation:  Person; Place   Recall/memory:  Defective in Immediate; Defective in Short-term; Defective in Recent; Defective in Remote   Affect and Mood  Affect:  Flat   Mood:  Depressed   Relating  Eye contact:  Avoided   Facial expression:  Anxious   Attitude toward examiner:  Cooperative   Thought and Language  Speech flow: Soft; Slow   Thought content:  Appropriate to Mood and Circumstances   Preoccupation:  None   Hallucinations:  None   Organization:  Circumstantial   Company secretary of Knowledge:  Fair   Intelligence:  Average   Abstraction:  Normal   Judgement:  Impaired   Reality Testing:  -- (UTA)   Insight:  None/zero insight   Decision Making:  Impulsive; Confused   Social Functioning  Social Maturity:  Responsible   Social Judgement:  Normal   Stress  Stressors:  Other (Comment) (unable to identify stressors)   Coping Ability:  Overwhelmed   Skill Deficits:  None   Supports:  Support needed; Family     Religion: Religion/Spirituality Are You A Religious Person?:  (UTA) How Might This Affect Treatment?: UTA  Leisure/Recreation: Leisure / Recreation Do You Have Hobbies?:  (UTA)  Exercise/Diet: Exercise/Diet Do You Exercise?:  (UTA) Have You Gained or Lost A Significant Amount of Weight in the Past Six Months?:  (UTA) Do You Follow a Special Diet?: No Do You Have Any Trouble Sleeping?: Yes Explanation of Sleeping Difficulties: Reports unstable sleeping patterns   CCA Employment/Education Employment/Work Situation: Employment / Work Situation Employment Situation: Employed Work Stressors: Herbalist Job has Been Impacted by Current Illness: No Has Patient ever Been in Equities trader?: No  Education: Education Is Patient Currently Attending School?: No Last Grade Completed: 12 Did You Product manager?: Yes What Type of College Degree Do you Have?: Reports he received a Chief Operating Officer  degree Did You Have An Individualized Education Program (IIEP): No Did You Have Any Difficulty At School?: No Patient's Education Has Been Impacted by Current Illness: No   CCA Family/Childhood History Family and Relationship History: Family history Marital status: Long term relationship Long term relationship, how long?: Reports he has a fiance' Erik Wilcox. UTA further What types of issues is patient dealing with in the relationship?: UTA Additional relationship information: UTA Does patient have children?: Yes How many children?: 3 How is patient's relationship with their children?: UTA  Childhood History:  Childhood History By whom was/is the patient raised?: Other (Comment) (UTA) Did patient suffer any verbal/emotional/physical/sexual abuse as a child?: No Did patient suffer from severe childhood neglect?: No Has patient ever been sexually abused/assaulted/raped as an adolescent or adult?: No Was the patient ever a victim of a crime or a disaster?:  No Witnessed domestic violence?: No Has patient been affected by domestic violence as an adult?: No       CCA Substance Use Alcohol/Drug Use: Alcohol / Drug Use Pain Medications: See MAR Prescriptions: See MAR Over the Counter: See MAR History of alcohol / drug use?: No history of alcohol / drug abuse                         ASAM's:  Six Dimensions of Multidimensional Assessment  Dimension 1:  Acute Intoxication and/or Withdrawal Potential:      Dimension 2:  Biomedical Conditions and Complications:      Dimension 3:  Emotional, Behavioral, or Cognitive Conditions and Complications:     Dimension 4:  Readiness to Change:     Dimension 5:  Relapse, Continued use, or Continued Problem Potential:     Dimension 6:  Recovery/Living Environment:     ASAM Severity Score:    ASAM Recommended Level of Treatment:     Substance use Disorder (SUD)    Recommendations for Services/Supports/Treatments:    Disposition  Recommendation per psychiatric provider: We recommend inpatient psychiatric hospitalization after medical hospitalization. Patient has been involuntarily committed on 08/15/23.    DSM5 Diagnoses: Patient Active Problem List   Diagnosis Date Noted   Syncope 08/14/2023   Fall at home, initial encounter 08/14/2023   Elevated troponin 08/14/2023   Leukocytosis 08/14/2023   Dehydration 08/14/2023   Neck pain 09/14/2012   Allergic rhinitis 09/14/2012     Referrals to Alternative Service(s): Referred to Alternative Service(s):   Place:   Date:   Time:    Referred to Alternative Service(s):   Place:   Date:   Time:    Referred to Alternative Service(s):   Place:   Date:   Time:    Referred to Alternative Service(s):   Place:   Date:   Time:     Hampton Wixom C Wilburn Keir, LCMHCA

## 2023-08-16 NOTE — ED Notes (Signed)
 [facility] department called for transport to [facility], they do not transport after 1pm on the weekend. Voicemail was left for them to call back for transport tomorrow.

## 2023-08-16 NOTE — ED Notes (Signed)
 Erik Wilcox from [facility] called with a bed offer.   [address] unit- accepting MD Montel Antu  Ashley's contact - [phone] Unit number for report- [phone]

## 2023-08-16 NOTE — ED Provider Notes (Signed)
 Emergency Medicine Observation Re-evaluation Note  Erik Wilcox is a 58 y.o. male, seen on rounds today.  Pt initially presented to the ED for complaints of Psychiatric Evaluation Currently, the patient is resting. No current complaints. No headache.  Physical Exam  BP (!) 178/97 (BP Location: Left Arm)   Pulse 95   Temp 98.4 F (36.9 C) (Oral)   Resp 20   SpO2 98%  Physical Exam General: resting, no distress HEENT: scalp wound bandaged Lungs: normal effort Neuro/Psych: no agitation. Does not remember why he's here or going to the police station.  ED Course / MDM  EKG:EKG Interpretation Date/Time:  Friday Aug 15 2023 16:59:53 EDT Ventricular Rate:  105 PR Interval:  140 QRS Duration:  124 QT Interval:  347 QTC Calculation: 459 R Axis:   -1  Text Interpretation: Sinus tachycardia IVCD, consider atypical RBBB Confirmed by Russella Courts ([id number]) on 08/15/2023 5:44:09 PM  I have reviewed the labs performed to date as well as medications administered while in observation.  Recent changes in the last 24 hours include given sedation meds for agitation last night.  Plan  Current plan is for psychiatric consultation/admission.    Jerilynn Montenegro, MD 08/16/23 [phone]

## 2023-08-17 NOTE — ED Notes (Signed)
 Transport called back & reported ETA 25 min

## 2023-08-17 NOTE — ED Notes (Signed)
 Pt informed of transferring to another facility & is okay with it, all belongings being gathered & all paper work is ready for pt to leave with officers to [facility].

## 2023-08-17 NOTE — ED Notes (Signed)
 This RN called with updated report to Accepting Facility for this pt & spoke to [facility] L. RN

## 2023-08-17 NOTE — ED Provider Notes (Signed)
 Stable for transport - EMTALA filed   Arvilla Birmingham, MD 08/17/23 [phone]

## 2023-08-22 ENCOUNTER — Telehealth: Payer: Self-pay

## 2023-08-22 NOTE — Telephone Encounter (Signed)
 Please advise.   Copied from CRM [id number]. Topic: Appointments - Transfer of Care >> Aug 22, 2023 10:24 AM DeAngela L wrote: Pt is requesting to transfer FROM: No PcP listed at this time  Pt is requesting to transfer TO: Dr Liane Redman  Reason for requested transfer: Patient is being discharged from the hospital and he states his PCP was Dr Liane Redman and he had a fall 08/13/23 and have staples in his head, Hospital was going to schedule hospital follow up 7 days after discharge 08/22/23 but he had no PCP  It is the responsibility of the team the patient would like to transfer to (Dr. Liane Redman) to reach out to the patient if for any reason this transfer is not acceptable.  Pt number [id number] (H) Patients dads number Josefina Nian [phone] if not able to contact the patient
